# Patient Record
Sex: Male | Born: 1990 | Race: White | Hispanic: No | Marital: Single | Smoking: Never smoker
Health system: Southern US, Community
[De-identification: ages and names within clinical notes are randomized; demographics above are authoritative.]

## PROBLEM LIST (undated history)

## (undated) DIAGNOSIS — J45909 Unspecified asthma, uncomplicated: Secondary | ICD-10-CM

## (undated) HISTORY — DX: Unspecified asthma, uncomplicated: J45.909

## (undated) HISTORY — PX: ACHILLES TENDON REPAIR: SUR1153

---
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
## 2009-01-15 ENCOUNTER — Encounter (INDEPENDENT_AMBULATORY_CARE_PROVIDER_SITE_OTHER): Payer: Self-pay | Admitting: *Deleted

## 2009-01-15 ENCOUNTER — Ambulatory Visit: Payer: Self-pay | Admitting: Internal Medicine

## 2009-12-03 ENCOUNTER — Emergency Department (HOSPITAL_BASED_OUTPATIENT_CLINIC_OR_DEPARTMENT_OTHER): Admission: EM | Admit: 2009-12-03 | Discharge: 2009-12-03 | Payer: Self-pay | Admitting: Emergency Medicine

## 2010-08-24 ENCOUNTER — Encounter: Payer: Self-pay | Admitting: Internal Medicine

## 2010-08-24 ENCOUNTER — Ambulatory Visit (INDEPENDENT_AMBULATORY_CARE_PROVIDER_SITE_OTHER): Payer: BC Managed Care – PPO | Admitting: Internal Medicine

## 2010-08-24 DIAGNOSIS — M549 Dorsalgia, unspecified: Secondary | ICD-10-CM

## 2010-09-01 NOTE — Assessment & Plan Note (Signed)
Summary: back pain   Vital Signs:  Patient profile:   20 year old male Height:      66 inches Weight:      175.13 pounds BMI:     28.37 Pulse rate:   96 / minute Pulse rhythm:   regular BP sitting:   122 / 86  (left arm) Cuff size:   regular  Vitals Entered By: Army Fossa CMA (August 24, 2010 2:30 PM) CC: Pt here injured bak on Thursday- lower back pain Comments while sitting or walking feels pain [facility] [address]    History of Present Illness: was exercising 4 days ago, did a back flip as soon as he landed he felt pain the R lower  back. no radiation aleve OTC little help symptoms increase if bends  forward  ROS no fever no nausea, vomiting no b/b incontinence, no LE paresthesias    Current Medications (verified): 1)  None  Allergies (verified): 1)  ! Percocet  Past History:  Past Medical History: Reviewed history from 02/19/2008 and no changes required. was Rx albuterol  one time in 2005 aprox. No dx of asthma  Past Surgical History: Reviewed history from 02/19/2008 and no changes required. left achilles tendon repair 3/09  Family History: Reviewed history from 02/19/2008 and no changes required. PATIENT ADOPTED  Social History: Reviewed history from 02/19/2008 and no changes required. lives w/ room mates goes to [facility] tobacco-- rarely  Physical Exam  General:  alert, well-developed, and well-nourished.   Abdomen:  soft, non-tender, normal bowel sounds, no distention, and no masses.   Extremities:  no pretibial edema bilaterally  Neurologic:  alert & oriented X3, cranial nerves II-XII intact, strength normal in all extremities, and DTRs symmetrical and normal.  streight leg test neg. +antalgic posture    Impression & Recommendations:  Problem # 1:  BACK PAIN (ICD-724.5) acute back injury, no radicular symptoms rec conservative treatment, see instructions  His updated medication list for this problem includes:    Naproxen 500 Mg Tabs  (Naproxen) .Marland Kitchen... 1 by mouth two times a day as needed for pain    Flexeril 10 Mg Tabs (Cyclobenzaprine hcl) .Marland Kitchen... 1 by mouth by mouth at bedtime as needed  Complete Medication List: 1)  Naproxen 500 Mg Tabs (Naproxen) .Marland Kitchen.. 1 by mouth two times a day as needed for pain 2)  Flexeril 10 Mg Tabs (Cyclobenzaprine hcl) .Marland Kitchen.. 1 by mouth by mouth at bedtime as needed  Patient Instructions: 1)  rest 2)  warm compress 3)  naproxen as needed for pain, take with foods, watch for stomach irritation  4)  flexeril, muscle relaxant at night 5)  call if no better 2 weeks  Prescriptions: FLEXERIL 10 MG TABS (CYCLOBENZAPRINE HCL) 1 by mouth by mouth at bedtime as needed  #21 x 0   Entered and Authorized by:   Nolon Rod. Paz MD   Signed by:   Nolon Rod. Paz MD on 08/24/2010   Method used:   Print then Give to Patient   RxID:   [id number] NAPROXEN 500 MG TABS (NAPROXEN) 1 by mouth two times a day as needed for pain  #60 x 0   Entered and Authorized by:   Nolon Rod. Paz MD   Signed by:   Nolon Rod. Paz MD on 08/24/2010   Method used:   Print then Give to Patient   RxID:   [id number]    Orders Added: 1)  Est. Patient Level III [95284]

## 2012-05-05 ENCOUNTER — Emergency Department (HOSPITAL_BASED_OUTPATIENT_CLINIC_OR_DEPARTMENT_OTHER): Payer: BC Managed Care – PPO

## 2012-05-05 ENCOUNTER — Encounter (HOSPITAL_BASED_OUTPATIENT_CLINIC_OR_DEPARTMENT_OTHER): Payer: Self-pay

## 2012-05-05 ENCOUNTER — Emergency Department (HOSPITAL_BASED_OUTPATIENT_CLINIC_OR_DEPARTMENT_OTHER)
Admission: EM | Admit: 2012-05-05 | Discharge: 2012-05-05 | Disposition: A | Discharge status: Home / Self Care | Payer: BC Managed Care – PPO | Attending: Emergency Medicine | Admitting: Emergency Medicine

## 2012-05-05 DIAGNOSIS — Y9351 Activity, roller skating (inline) and skateboarding: Secondary | ICD-10-CM | POA: Insufficient documentation

## 2012-05-05 DIAGNOSIS — X500XXA Overexertion from strenuous movement or load, initial encounter: Secondary | ICD-10-CM | POA: Insufficient documentation

## 2012-05-05 DIAGNOSIS — S63501A Unspecified sprain of right wrist, initial encounter: Secondary | ICD-10-CM

## 2012-05-05 DIAGNOSIS — Y929 Unspecified place or not applicable: Secondary | ICD-10-CM | POA: Insufficient documentation

## 2012-05-05 DIAGNOSIS — S63509A Unspecified sprain of unspecified wrist, initial encounter: Secondary | ICD-10-CM | POA: Insufficient documentation

## 2012-05-05 MED ORDER — NAPROXEN 500 MG PO TABS: 500.0000 mg | ORAL_TABLET | Freq: Two times a day (BID) | ORAL | Status: AC

## 2012-05-05 NOTE — ED Notes (Addendum)
Right hand injury last night while skate boarding

## 2012-05-05 NOTE — ED Provider Notes (Addendum)
History     CSN: [id number]  Arrival date & time 05/05/12  1229   First MD Initiated Contact with Patient 05/05/12 1242      Chief Complaint  Patient presents with  . Hand Injury    (Consider location/radiation/quality/duration/timing/severity/associated sxs/prior treatment) Patient is a 21 y.o. male presenting with hand injury. The history is provided by the patient.  Hand Injury  Pertinent negatives include no fever.   injury to the right wrist skateboarding yesterday. Patient had a hyperextension of the wrist on the right side he is right hand dominant. No other injuries. Patient states the wrist pain is 8/10 at worse currently 5/10. Described as sharp ache. Pain is nonradiating.  History reviewed. No pertinent past medical history.  Past Surgical History  Procedure Date  . Achilles tendon repair     No family history on file.  History  Substance Use Topics  . Smoking status: Never Smoker   . Smokeless tobacco: Not on file  . Alcohol Use: No      Review of Systems  Constitutional: Negative for fever.  HENT: Negative for neck pain.   Respiratory: Negative for shortness of breath.   Cardiovascular: Negative for chest pain.  Gastrointestinal: Negative for abdominal pain.  Musculoskeletal: Negative for back pain.  Skin: Negative for rash and wound.  Neurological: Negative for headaches.  Hematological: Does not bruise/bleed easily.    Allergies  Oxycodone-acetaminophen  Home Medications   Current Outpatient Rx  Name Route Sig Dispense Refill  . NAPROXEN 500 MG PO TABS Oral Take 1 tablet (500 mg total) by mouth 2 (two) times daily. 14 tablet 0    BP 140/85  Pulse 77  Temp 98.1 F (36.7 C) (Oral)  Resp 16  Ht 5\' 6"  (1.676 m)  Wt 180 lb (81.647 kg)  BMI 29.05 kg/m2  SpO2 100%  Physical Exam  Nursing note and vitals reviewed. Constitutional: He is oriented to person, place, and time. He appears well-developed and well-nourished. No distress.  HENT:    Head: Normocephalic and atraumatic.  Mouth/Throat: Oropharynx is clear and moist.  Eyes: Conjunctivae normal and EOM are normal. Pupils are equal, round, and reactive to light.  Neck: Normal range of motion. Neck supple.  Pulmonary/Chest: Effort normal and breath sounds normal. No respiratory distress.  Abdominal: Soft. There is no tenderness.  Musculoskeletal: He exhibits edema and tenderness.       Normal except for right wrist with swelling on the dorsum of the right wrist on the ulnar side predominantly. No snuff box tenderness. Neurocirculatory intact distally good range of motion of the fingers. No elbow tenderness no shoulder tenderness.  Neurological: He is alert and oriented to person, place, and time. No cranial nerve deficit. He exhibits normal muscle tone. Coordination normal.  Skin: Skin is warm. No rash noted.    ED Course  Procedures (including critical care time)  Labs Reviewed - No data to display Dg Hand Complete Right  05/05/2012  *RADIOLOGY REPORT*  Clinical Data: Larey Seat.  Pain of the fourth and fifth fingers.  RIGHT HAND - COMPLETE 3+ VIEW  Comparison: None.  Findings: No evidence of fracture or dislocation, with specific attention to the base of the fourth and fifth metacarpals.  IMPRESSION: Negative radiographs   Original Report Authenticated By: Paulina Fusi, M.D.      1. Sprain of wrist, right       MDM  Patient with right wrist sprain. Does not have any snuffbox tenderness no concern about a navicular  fracture. Neurovascular is intact. Does have swelling to the dorsum of the right hand out laterally. X-rays are negative for fracture. We'll treat with a wrist splint elevation anti-inflammatories and followup if not improving in one week. Patient knows to use the wrist after a couple days to encourage quicker healing.        Shelda Jakes, MD 05/05/12 1339  Shelda Jakes, MD 05/05/12 1340

## 2013-06-19 IMAGING — CR DG HAND COMPLETE 3+V*R*
3 series · 3 of 3 positions shown · non-contrast
Comparison: None.

CLINICAL DATA: Fell.  Pain of the fourth and fifth fingers.

RIGHT HAND - COMPLETE 3+ VIEW

[x hand pa right]
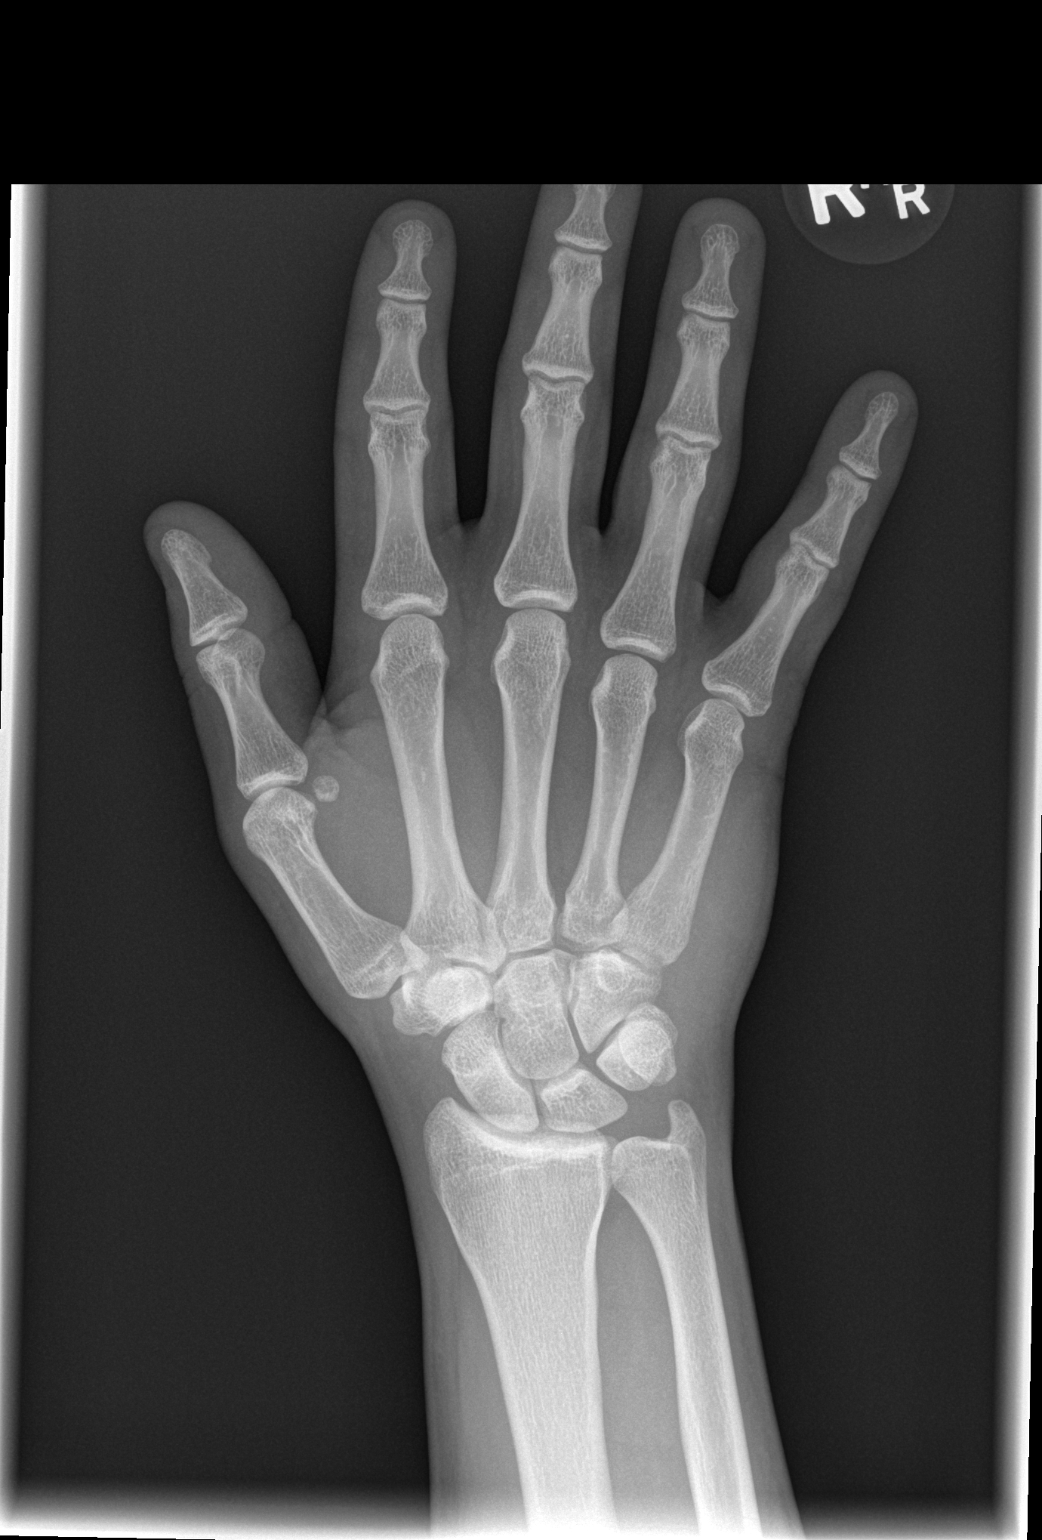

[x hand oblique right]
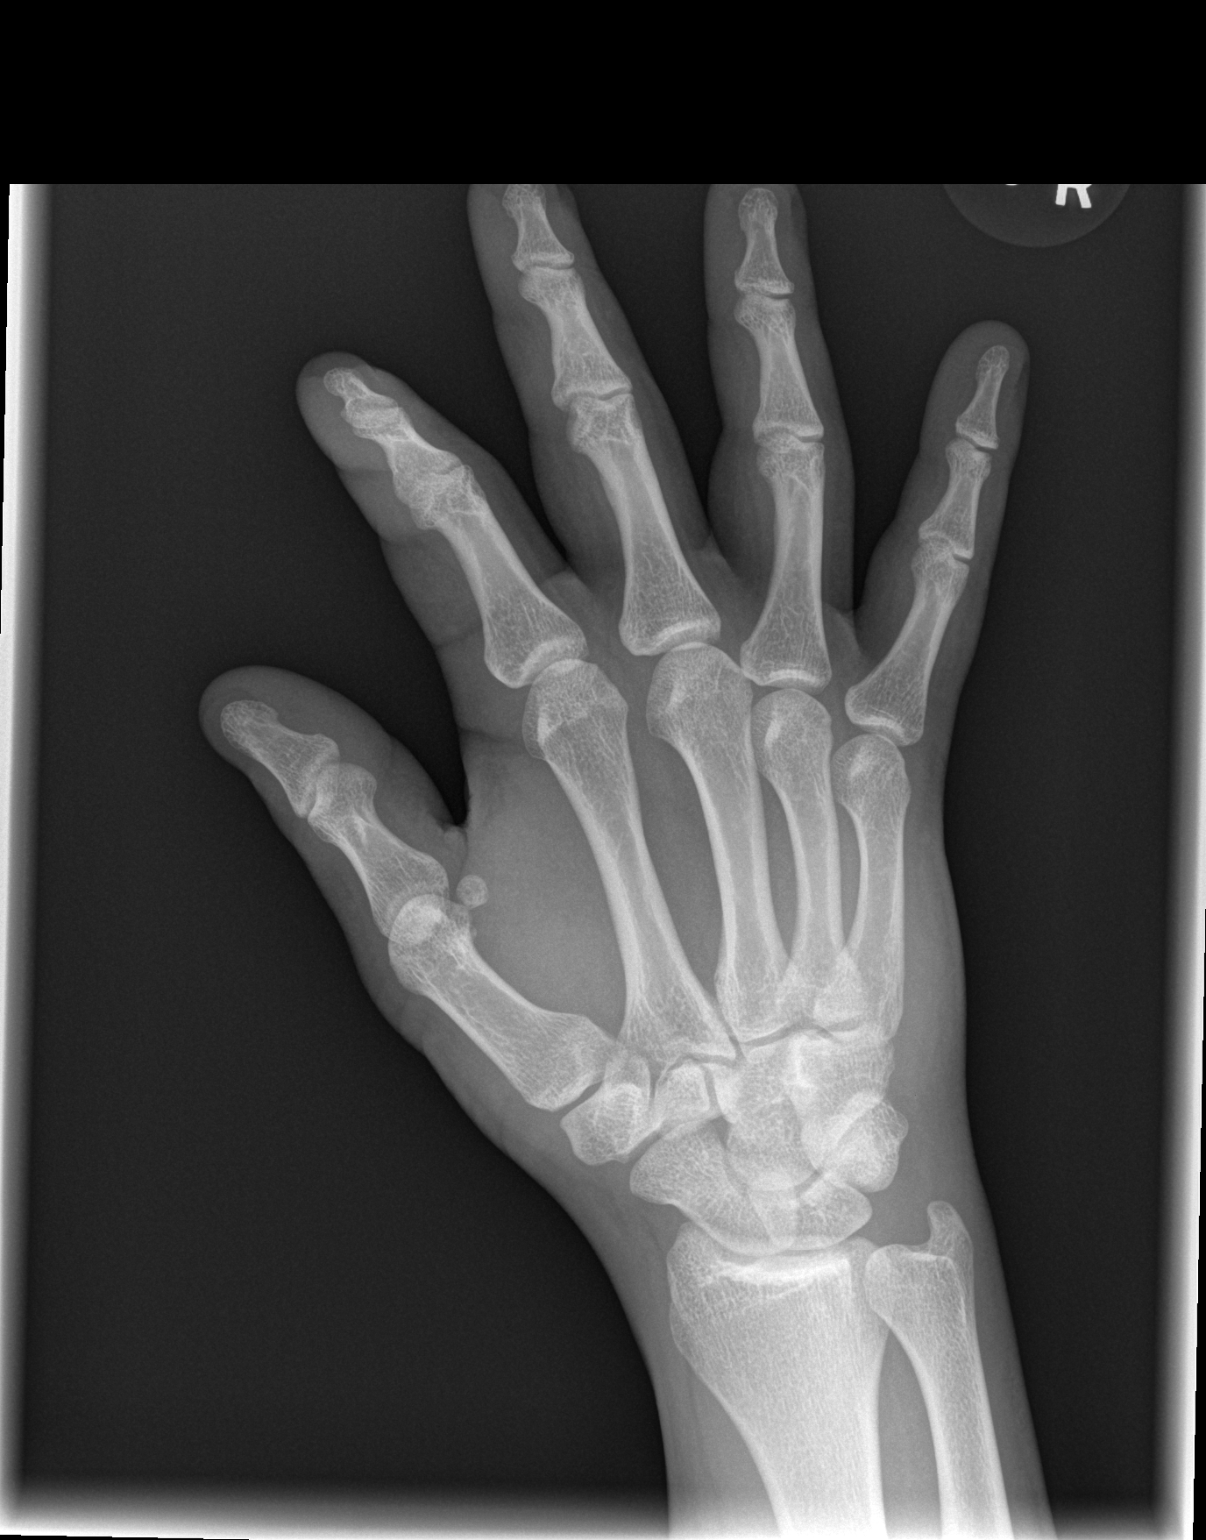

[x hand lat right]
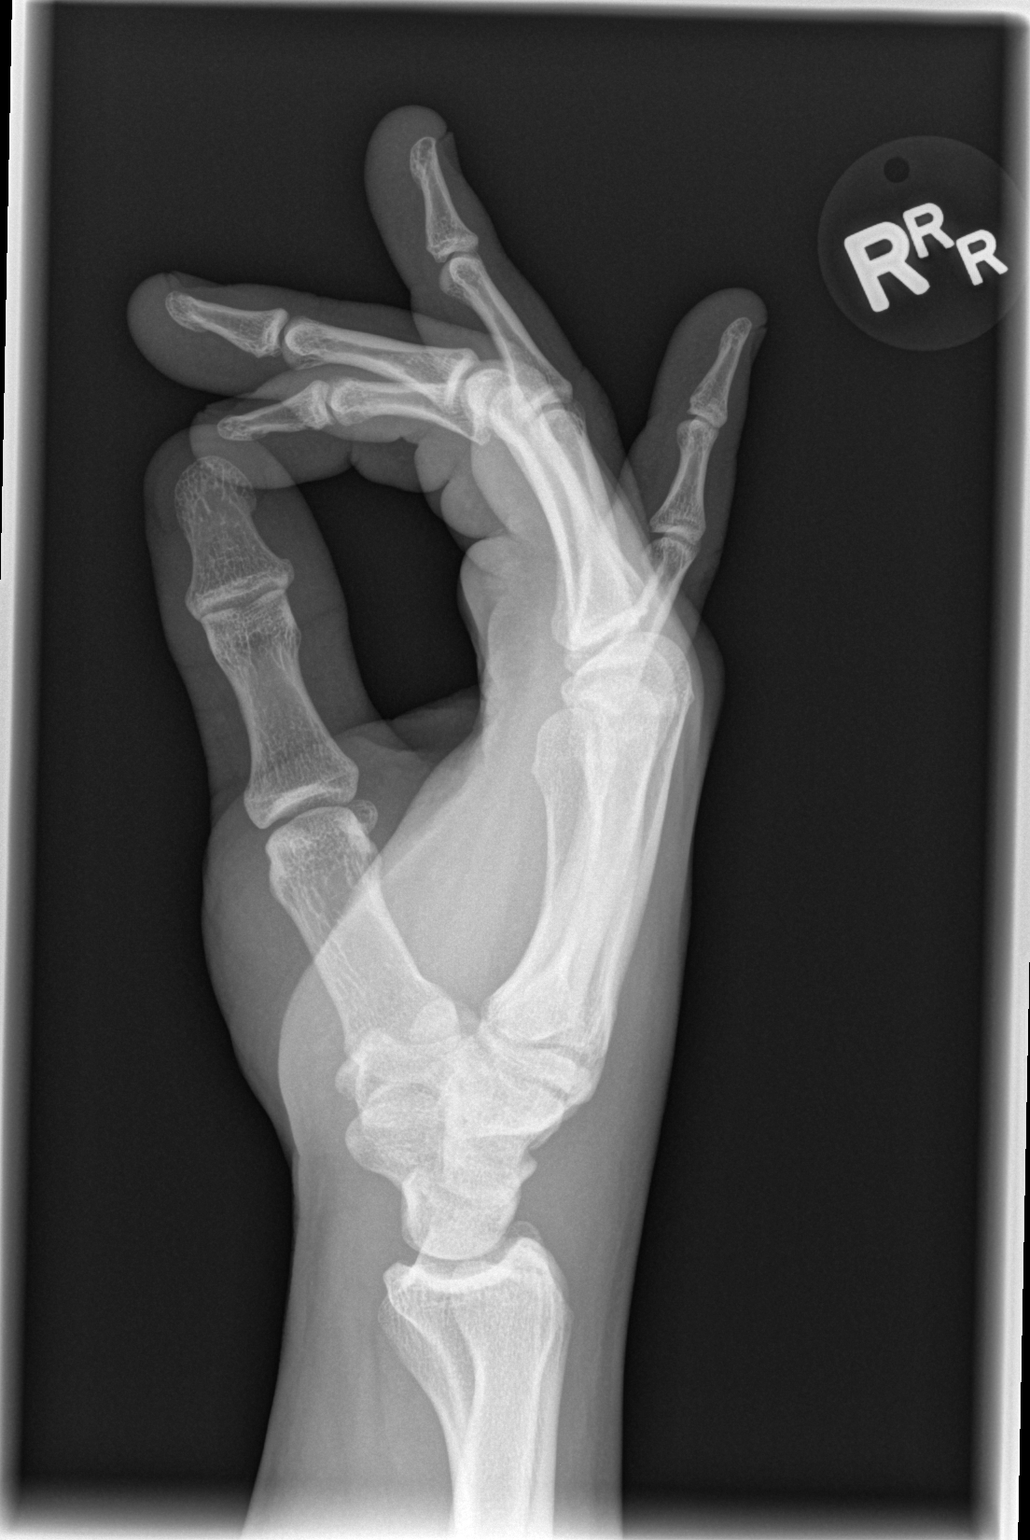

[3 of 3 positions shown; findings below may reference images not displayed]

FINDINGS: No evidence of fracture or dislocation, with specific
attention to the base of the fourth and fifth metacarpals.
IMPRESSION: Negative radiographs

## 2014-08-26 ENCOUNTER — Ambulatory Visit: Payer: Self-pay | Admitting: Medical

## 2021-01-27 ENCOUNTER — Telehealth: Payer: Self-pay

## 2021-01-27 NOTE — Telephone Encounter (Signed)
Scheduled

## 2021-01-27 NOTE — Telephone Encounter (Signed)
Please advise 

## 2021-01-27 NOTE — Telephone Encounter (Signed)
That is okay, thank you 

## 2021-01-27 NOTE — Telephone Encounter (Signed)
Would like to re-establish care with Dr. Drue Novel

## 2021-01-27 NOTE — Telephone Encounter (Signed)
Okay to schedule NP appt with Dr. Drue Novel. Thank you.

## 2021-02-18 ENCOUNTER — Ambulatory Visit: Payer: Self-pay | Admitting: Internal Medicine
